# Patient Record
Sex: Male | Born: 1970 | Race: White | Hispanic: No | Marital: Single | Smoking: Current every day smoker
Health system: Southern US, Community
[De-identification: ages and names within clinical notes are randomized; demographics above are authoritative.]

## PROBLEM LIST (undated history)

## (undated) DIAGNOSIS — E039 Hypothyroidism, unspecified: Secondary | ICD-10-CM

## (undated) DIAGNOSIS — F319 Bipolar disorder, unspecified: Secondary | ICD-10-CM

## (undated) DIAGNOSIS — F419 Anxiety disorder, unspecified: Secondary | ICD-10-CM

## (undated) DIAGNOSIS — F209 Schizophrenia, unspecified: Secondary | ICD-10-CM

---

## 2006-09-16 ENCOUNTER — Emergency Department (HOSPITAL_COMMUNITY): Admission: EM | Admit: 2006-09-16 | Discharge: 2006-09-16 | Payer: Self-pay | Admitting: Emergency Medicine

## 2008-11-23 ENCOUNTER — Emergency Department (HOSPITAL_COMMUNITY): Admission: EM | Admit: 2008-11-23 | Discharge: 2008-11-23 | Payer: Self-pay | Admitting: Emergency Medicine

## 2009-04-07 ENCOUNTER — Emergency Department (HOSPITAL_COMMUNITY): Admission: EM | Admit: 2009-04-07 | Discharge: 2009-04-07 | Payer: Self-pay | Admitting: Emergency Medicine

## 2012-08-31 ENCOUNTER — Emergency Department (HOSPITAL_COMMUNITY)
Admission: EM | Admit: 2012-08-31 | Discharge: 2012-08-31 | Discharge status: Against Medical Advice | Payer: Self-pay | Attending: Emergency Medicine | Admitting: Emergency Medicine

## 2012-08-31 DIAGNOSIS — R404 Transient alteration of awareness: Secondary | ICD-10-CM | POA: Insufficient documentation

## 2012-08-31 DIAGNOSIS — IMO0002 Reserved for concepts with insufficient information to code with codable children: Secondary | ICD-10-CM | POA: Insufficient documentation
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
## 2012-08-31 NOTE — ED Notes (Addendum)
Upon arrival to our facility pt was alert and stating "Stop" repeatedly. Pt told need to examine him and check for injuries. Pt persistent in refusing any type of care. ER MD telling patient " You could die if you leave now". Pt states "I don't care I am leaving. Stop caring for me". Pt was alert and oriented. Able to read the time on the clock. Ambulated from stretcher to discharge without difficulty. Pt came over to this RN computer to sign AMA, refused to sign and left. No obvious injuries noted, abrasion to chin. Airway intact, did not seem to be in any distress. Pt adamant about leaving and denied any drug use.

## 2012-11-18 ENCOUNTER — Emergency Department (HOSPITAL_COMMUNITY)
Admission: EM | Admit: 2012-11-18 | Discharge: 2012-11-18 | Disposition: A | Discharge status: Home / Self Care | Payer: Self-pay | Attending: Emergency Medicine | Admitting: Emergency Medicine

## 2012-11-18 ENCOUNTER — Encounter (HOSPITAL_COMMUNITY): Payer: Self-pay | Admitting: Emergency Medicine

## 2012-11-18 ENCOUNTER — Emergency Department (HOSPITAL_COMMUNITY): Payer: Self-pay

## 2012-11-18 DIAGNOSIS — R0602 Shortness of breath: Secondary | ICD-10-CM | POA: Insufficient documentation

## 2012-11-18 DIAGNOSIS — F319 Bipolar disorder, unspecified: Secondary | ICD-10-CM | POA: Insufficient documentation

## 2012-11-18 DIAGNOSIS — F209 Schizophrenia, unspecified: Secondary | ICD-10-CM | POA: Insufficient documentation

## 2012-11-18 DIAGNOSIS — Z87891 Personal history of nicotine dependence: Secondary | ICD-10-CM | POA: Insufficient documentation

## 2012-11-18 DIAGNOSIS — F411 Generalized anxiety disorder: Secondary | ICD-10-CM | POA: Insufficient documentation

## 2012-11-18 DIAGNOSIS — F1010 Alcohol abuse, uncomplicated: Secondary | ICD-10-CM | POA: Insufficient documentation

## 2012-11-18 DIAGNOSIS — F10929 Alcohol use, unspecified with intoxication, unspecified: Secondary | ICD-10-CM

## 2012-11-18 HISTORY — DX: Schizophrenia, unspecified: F20.9

## 2012-11-18 HISTORY — DX: Bipolar disorder, unspecified: F31.9

## 2012-11-18 HISTORY — DX: Anxiety disorder, unspecified: F41.9

## 2012-11-18 LAB — RAPID URINE DRUG SCREEN, HOSP PERFORMED
Cocaine: POSITIVE — AB
Opiates: NOT DETECTED
Tetrahydrocannabinol: NOT DETECTED

## 2012-11-18 LAB — CBC
HCT: 42.6 % (ref 39.0–52.0)
Hemoglobin: 15.2 g/dL (ref 13.0–17.0)
MCH: 32.1 pg (ref 26.0–34.0)
MCV: 90.1 fL (ref 78.0–100.0)
Platelets: 203 10*3/uL (ref 150–400)
RBC: 4.73 MIL/uL (ref 4.22–5.81)
WBC: 6.1 10*3/uL (ref 4.0–10.5)

## 2012-11-18 LAB — COMPREHENSIVE METABOLIC PANEL
AST: 214 U/L — ABNORMAL HIGH (ref 0–37)
CO2: 23 mEq/L (ref 19–32)
Chloride: 102 mEq/L (ref 96–112)
Creatinine, Ser: 0.96 mg/dL (ref 0.50–1.35)
GFR calc Af Amer: >90 mL/min (ref >90)
GFR calc non Af Amer: >90 mL/min (ref >90)
Glucose, Bld: 100 mg/dL — ABNORMAL HIGH (ref 70–99)
Total Bilirubin: 0.3 mg/dL (ref 0.3–1.2)

## 2012-11-18 NOTE — ED Notes (Signed)
With assistance of police at pt bedside, pt turned over to lay supine.  EKG completed.  Pt awakened only for a short while then went back to sleep.

## 2012-11-18 NOTE — ED Notes (Signed)
Pt is not cooperating well, sleeping heavily between time we awaken him.  Is unwilling to share much information.  When asked about allergies, pt said "everything" then rattled off multiple medications.  When asked about reactions he said "all"    Unreliable history.
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
## 2012-11-18 NOTE — ED Notes (Signed)
18 ga IV that was placed by EMS right forearm removed; entire cath in tact.

## 2012-11-18 NOTE — ED Provider Notes (Signed)
CSN: [id number]     Arrival date & time 11/18/12  0344 History   First MD Initiated Contact with Patient 11/18/12 0354     Chief Complaint  Patient presents with  . Chest Pain  . Alcohol Intoxication   (Consider location/radiation/quality/duration/timing/severity/associated sxs/prior Treatment) Patient is a 42 y.o. male presenting with chest pain and intoxication. The history is provided by the patient and the police. The history is limited by the condition of the patient.  Chest Pain Alcohol Intoxication Associated symptoms include chest pain.  pt was arrested for ?public intoxication, stealing beer, was walking from police car to jail when was noted to c/o chest pain. On arrival to ED w gpd, pt very uncooperative, appears intoxicated, and will not answer questions  - level 5 caveat.     No past medical history on file. No past surgical history on file. No family history on file. History  Substance Use Topics  . Smoking status: Not on file  . Smokeless tobacco: Not on file  . Alcohol Use: Not on file    Review of Systems  Unable to perform ROS: Other  Cardiovascular: Positive for chest pain.  level 5 caveat, pt intoxicated, uncooperative w hpi and ros.      Allergies  Review of patient's allergies indicates not on file.  Home Medications  No current outpatient prescriptions on file. BP 105/67  Pulse 70  Temp(Src) 97.5 F (36.4 C)  Resp 16  Ht 5\' 11"  (1.803 m)  Wt 195 lb (88.451 kg)  BMI 27.21 kg/m2  SpO2 96% Physical Exam  Nursing note and vitals reviewed. Constitutional: He appears well-developed and well-nourished. No distress.  HENT:  Head: Atraumatic.  Eyes: Pupils are equal, round, and reactive to light. No scleral icterus.  Neck: Neck supple. No tracheal deviation present.  Cardiovascular: Normal rate, regular rhythm, normal heart sounds and intact distal pulses.  Exam reveals no gallop and no friction rub.   No murmur heard. Pulmonary/Chest: Effort  normal and breath sounds normal. No accessory muscle usage. No respiratory distress. He exhibits tenderness.  Abdominal: Soft. Bowel sounds are normal. He exhibits no distension. There is no tenderness. There is no rebound and no guarding.  Musculoskeletal: Normal range of motion. He exhibits no edema and no tenderness.  Neurological: He is alert.  Alert but uncooperative, intoxicated appearing. Makes purposeful movement bil ext, but not cooperative w exam.   Skin: Skin is warm and dry. He is not diaphoretic.  Psychiatric:  Uncooperative.     ED Course  Procedures (including critical care time)  Results for orders placed during the hospital encounter of 11/18/12  ETHANOL      Result Value Range   Alcohol, Ethyl (B) 288 (*) 0 - 11 mg/dL  CBC      Result Value Range   WBC 6.1  4.0 - 10.5 K/uL   RBC 4.73  4.22 - 5.81 MIL/uL   Hemoglobin 15.2  13.0 - 17.0 g/dL   HCT 40.9  81.1 - 91.4 %   MCV 90.1  78.0 - 100.0 fL   MCH 32.1  26.0 - 34.0 pg   MCHC 35.7  30.0 - 36.0 g/dL   RDW 78.2  95.6 - 21.3 %   Platelets 203  150 - 400 K/uL  COMPREHENSIVE METABOLIC PANEL      Result Value Range   Sodium 139  135 - 145 mEq/L   Potassium 3.3 (*) 3.5 - 5.1 mEq/L   Chloride 102  96 - 112  mEq/L   CO2 23  19 - 32 mEq/L   Glucose, Bld 100 (*) 70 - 99 mg/dL   BUN 10  6 - 23 mg/dL   Creatinine, Ser 1.61  0.50 - 1.35 mg/dL   Calcium 8.9  8.4 - 09.6 mg/dL   Total Protein 7.3  6.0 - 8.3 g/dL   Albumin 3.9  3.5 - 5.2 g/dL   AST 045 (*) 0 - 37 U/L   ALT 235 (*) 0 - 53 U/L   Alkaline Phosphatase 66  39 - 117 U/L   Total Bilirubin 0.3  0.3 - 1.2 mg/dL   GFR calc non Af Amer >90  >90 mL/min   GFR calc Af Amer >90  >90 mL/min  TROPONIN I      Result Value Range   Troponin I <0.30  <0.30 ng/mL   Dg Chest Port 1 View  11/18/2012   CLINICAL DATA:  Shortness of breath.  EXAM: PORTABLE CHEST - 1 VIEW  COMPARISON:  No priors.  FINDINGS: Lung volumes are low. No consolidative airspace disease. No pleural  effusions. No pneumothorax. No pulmonary nodule or mass noted. Pulmonary vasculature and the cardiomediastinal silhouette are within normal limits.  IMPRESSION: 1.  No radiographic evidence of acute cardiopulmonary disease.   Electronically Signed   By: Trudie Reed M.D.   On: 11/18/2012 04:06     EKG Interpretation   None       MDM  Labs, ecg, cxr ordered.  Pt uncooperative. ems ecg of poor quality however no acute st changes noted.   Reviewed nursing notes and prior charts for additional history.   Recheck no cp or discomfort.  Recheck resting comfortably.   Ecg, trop and cxr unremarkable.  Pt with etoh intoxication.  Appears stable for d/c in custody of law enforcement.     Suzi Roots, MD 11/18/12 [phone]

## 2013-12-04 ENCOUNTER — Emergency Department (HOSPITAL_COMMUNITY): Payer: Self-pay

## 2013-12-04 ENCOUNTER — Encounter (HOSPITAL_COMMUNITY): Payer: Self-pay | Admitting: Nurse Practitioner

## 2013-12-04 ENCOUNTER — Emergency Department (HOSPITAL_COMMUNITY)
Admission: EM | Admit: 2013-12-04 | Discharge: 2013-12-04 | Disposition: A | Discharge status: Home / Self Care | Payer: Self-pay | Attending: Emergency Medicine | Admitting: Emergency Medicine

## 2013-12-04 DIAGNOSIS — Y9389 Activity, other specified: Secondary | ICD-10-CM | POA: Insufficient documentation

## 2013-12-04 DIAGNOSIS — Z8639 Personal history of other endocrine, nutritional and metabolic disease: Secondary | ICD-10-CM | POA: Insufficient documentation

## 2013-12-04 DIAGNOSIS — S01311A Laceration without foreign body of right ear, initial encounter: Secondary | ICD-10-CM | POA: Insufficient documentation

## 2013-12-04 DIAGNOSIS — S61204A Unspecified open wound of right ring finger without damage to nail, initial encounter: Secondary | ICD-10-CM | POA: Insufficient documentation

## 2013-12-04 DIAGNOSIS — F419 Anxiety disorder, unspecified: Secondary | ICD-10-CM | POA: Insufficient documentation

## 2013-12-04 DIAGNOSIS — Z79899 Other long term (current) drug therapy: Secondary | ICD-10-CM | POA: Insufficient documentation

## 2013-12-04 DIAGNOSIS — S199XXA Unspecified injury of neck, initial encounter: Secondary | ICD-10-CM | POA: Insufficient documentation

## 2013-12-04 DIAGNOSIS — Y9289 Other specified places as the place of occurrence of the external cause: Secondary | ICD-10-CM | POA: Insufficient documentation

## 2013-12-04 DIAGNOSIS — Y998 Other external cause status: Secondary | ICD-10-CM | POA: Insufficient documentation

## 2013-12-04 DIAGNOSIS — S0990XA Unspecified injury of head, initial encounter: Secondary | ICD-10-CM

## 2013-12-04 DIAGNOSIS — S61451A Open bite of right hand, initial encounter: Secondary | ICD-10-CM

## 2013-12-04 DIAGNOSIS — W503XXA Accidental bite by another person, initial encounter: Secondary | ICD-10-CM

## 2013-12-04 DIAGNOSIS — Z720 Tobacco use: Secondary | ICD-10-CM | POA: Insufficient documentation

## 2013-12-04 DIAGNOSIS — Z7982 Long term (current) use of aspirin: Secondary | ICD-10-CM | POA: Insufficient documentation

## 2013-12-04 HISTORY — DX: Hypothyroidism, unspecified: E03.9

## 2013-12-04 MED ORDER — ONDANSETRON 4 MG PO TBDP
4.0000 mg | ORAL_TABLET | Freq: Once | ORAL | Status: AC
  Administered 2013-12-04: 4 mg via ORAL
  Filled 2013-12-04: qty 1

## 2013-12-04 MED ORDER — AMOXICILLIN-POT CLAVULANATE 875-125 MG PO TABS
1.0000 | ORAL_TABLET | Freq: Once | ORAL | Status: AC
  Administered 2013-12-04: 1 via ORAL
  Filled 2013-12-04: qty 1

## 2013-12-04 MED ORDER — ONDANSETRON HCL 4 MG PO TABS
4.0000 mg | ORAL_TABLET | Freq: Four times a day (QID) | ORAL | Status: AC
Start: 2013-12-04 — End: ?

## 2013-12-04 MED ORDER — TRAMADOL HCL 50 MG PO TABS
50.0000 mg | ORAL_TABLET | Freq: Once | ORAL | Status: AC
  Administered 2013-12-04: 50 mg via ORAL
  Filled 2013-12-04: qty 1

## 2013-12-04 MED ORDER — OXYCODONE-ACETAMINOPHEN 5-325 MG PO TABS
1.0000 | ORAL_TABLET | Freq: Once | ORAL | Status: AC
Start: 2013-12-04 — End: 2013-12-04
  Administered 2013-12-04: 1 via ORAL
  Filled 2013-12-04: qty 1

## 2013-12-04 MED ORDER — AMOXICILLIN-POT CLAVULANATE 500-125 MG PO TABS
1.0000 | ORAL_TABLET | Freq: Three times a day (TID) | ORAL | Status: AC
Start: 2013-12-04 — End: ?

## 2013-12-04 MED ORDER — TRAMADOL HCL 50 MG PO TABS: 50.0000 mg | ORAL_TABLET | Freq: Four times a day (QID) | ORAL | Status: AC | PRN
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
## 2013-12-04 NOTE — Discharge Instructions (Signed)
Concussion °A concussion, or closed-head injury, is a brain injury caused by a direct blow to the head or by a quick and sudden movement (jolt) of the head or neck. Concussions are usually not life-threatening. Even so, the effects of a concussion can be serious. If you have had a concussion before, you are more likely to experience concussion-like symptoms after a direct blow to the head.  °CAUSES °· Direct blow to the head, such as from running into another player during a soccer game, being hit in a fight, or hitting your head on a hard surface. °· A jolt of the head or neck that causes the brain to move back and forth inside the skull, such as in a car crash. °SIGNS AND SYMPTOMS °The signs of a concussion can be hard to notice. Early on, they may be missed by you, family members, and health care providers. You may look fine but act or feel differently. °Symptoms are usually temporary, but they may last for days, weeks, or even longer. Some symptoms may appear right away while others may not show up for hours or days. Every head injury is different. Symptoms include: °· Mild to moderate headaches that will not go away. °· A feeling of pressure inside your head. °· Having more trouble than usual: °· Learning or remembering things you have heard. °· Answering questions. °· Paying attention or concentrating. °· Organizing daily tasks. °· Making decisions and solving problems. °· Slowness in thinking, acting or reacting, speaking, or reading. °· Getting lost or being easily confused. °· Feeling tired all the time or lacking energy (fatigued). °· Feeling drowsy. °· Sleep disturbances. °· Sleeping more than usual. °· Sleeping less than usual. °· Trouble falling asleep. °· Trouble sleeping (insomnia). °· Loss of balance or feeling lightheaded or dizzy. °· Nausea or vomiting. °· Numbness or tingling. °· Increased sensitivity to: °· Sounds. °· Lights. °· Distractions. °· Vision problems or eyes that tire  easily. °· Diminished sense of taste or smell. °· Ringing in the ears. °· Mood changes such as feeling sad or anxious. °· Becoming easily irritated or angry for little or no reason. °· Lack of motivation. °· Seeing or hearing things other people do not see or hear (hallucinations). °DIAGNOSIS °Your health care provider can usually diagnose a concussion based on a description of your injury and symptoms. He or she will ask whether you passed out (lost consciousness) and whether you are having trouble remembering events that happened right before and during your injury. °Your evaluation might include: °· A brain scan to look for signs of injury to the brain. Even if the test shows no injury, you may still have a concussion. °· Blood tests to be sure other problems are not present. °TREATMENT °· Concussions are usually treated in an emergency department, in urgent care, or at a clinic. You may need to stay in the hospital overnight for further treatment. °· Tell your health care provider if you are taking any medicines, including prescription medicines, over-the-counter medicines, and natural remedies. Some medicines, such as blood thinners (anticoagulants) and aspirin, may increase the chance of complications. Also tell your health care provider whether you have had alcohol or are taking illegal drugs. This information may affect treatment. °· Your health care provider will send you home with important instructions to follow. °· How fast you will recover from a concussion depends on many factors. These factors include how severe your concussion is, what part of your brain was injured, your   age, and how healthy you were before the concussion. °· Most people with mild injuries recover fully. Recovery can take time. In general, recovery is slower in older persons. Also, persons who have had a concussion in the past or have other medical problems may find that it takes longer to recover from their current injury. °HOME  CARE INSTRUCTIONS °General Instructions °· Carefully follow the directions your health care provider gave you. °· Only take over-the-counter or prescription medicines for pain, discomfort, or fever as directed by your health care provider. °· Take only those medicines that your health care provider has approved. °· Do not drink alcohol until your health care provider says you are well enough to do so. Alcohol and certain other drugs may slow your recovery and can put you at risk of further injury. °· If it is harder than usual to remember things, write them down. °· If you are easily distracted, try to do one thing at a time. For example, do not try to watch TV while fixing dinner. °· Talk with family members or close friends when making important decisions. °· Keep all follow-up appointments. Repeated evaluation of your symptoms is recommended for your recovery. °· Watch your symptoms and tell others to do the same. Complications sometimes occur after a concussion. Older adults with a brain injury may have a higher risk of serious complications, such as a blood clot on the brain. °· Tell your teachers, school nurse, school counselor, coach, athletic trainer, or work manager about your injury, symptoms, and restrictions. Tell them about what you can or cannot do. They should watch for: °· Increased problems with attention or concentration. °· Increased difficulty remembering or learning new information. °· Increased time needed to complete tasks or assignments. °· Increased irritability or decreased ability to cope with stress. °· Increased symptoms. °· Rest. Rest helps the brain to heal. Make sure you: °· Get plenty of sleep at night. Avoid staying up late at night. °· Keep the same bedtime hours on weekends and weekdays. °· Rest during the day. Take daytime naps or rest breaks when you feel tired. °· Limit activities that require a lot of thought or concentration. These include: °· Doing homework or job-related  work. °· Watching TV. °· Working on the computer. °· Avoid any situation where there is potential for another head injury (football, hockey, soccer, basketball, martial arts, downhill snow sports and horseback riding). Your condition will get worse every time you experience a concussion. You should avoid these activities until you are evaluated by the appropriate follow-up health care providers. °Returning To Your Regular Activities °You will need to return to your normal activities slowly, not all at once. You must give your body and brain enough time for recovery. °· Do not return to sports or other athletic activities until your health care provider tells you it is safe to do so. °· Ask your health care provider when you can drive, ride a bicycle, or operate heavy machinery. Your ability to react may be slower after a brain injury. Never do these activities if you are dizzy. °· Ask your health care provider about when you can return to work or school. °Preventing Another Concussion °It is very important to avoid another brain injury, especially before you have recovered. In rare cases, another injury can lead to permanent brain damage, brain swelling, or death. The risk of this is greatest during the first 7-10 days after a head injury. Avoid injuries by: °· Wearing a seat   belt when riding in a car. °· Drinking alcohol only in moderation. °· Wearing a helmet when biking, skiing, skateboarding, skating, or doing similar activities. °· Avoiding activities that could lead to a second concussion, such as contact or recreational sports, until your health care provider says it is okay. °· Taking safety measures in your home. °¨ Remove clutter and tripping hazards from floors and stairways. °¨ Use grab bars in bathrooms and handrails by stairs. °¨ Place non-slip mats on floors and in bathtubs. °¨ Improve lighting in dim areas. °SEEK MEDICAL CARE IF: °· You have increased problems paying attention or  concentrating. °· You have increased difficulty remembering or learning new information. °· You need more time to complete tasks or assignments than before. °· You have increased irritability or decreased ability to cope with stress. °· You have more symptoms than before. °Seek medical care if you have any of the following symptoms for more than 2 weeks after your injury: °· Lasting (chronic) headaches. °· Dizziness or balance problems. °· Nausea. °· Vision problems. °· Increased sensitivity to noise or light. °· Depression or mood swings. °· Anxiety or irritability. °· Memory problems. °· Difficulty concentrating or paying attention. °· Sleep problems. °· Feeling tired all the time. °SEEK IMMEDIATE MEDICAL CARE IF: °· You have severe or worsening headaches. These may be a sign of a blood clot in the brain. °· You have weakness (even if only in one hand, leg, or part of the face). °· You have numbness. °· You have decreased coordination. °· You vomit repeatedly. °· You have increased sleepiness. °· One pupil is larger than the other. °· You have convulsions. °· You have slurred speech. °· You have increased confusion. This may be a sign of a blood clot in the brain. °· You have increased restlessness, agitation, or irritability. °· You are unable to recognize people or places. °· You have neck pain. °· It is difficult to wake you up. °· You have unusual behavior changes. °· You lose consciousness. °MAKE SURE YOU: °· Understand these instructions. °· Will watch your condition. °· Will get help right away if you are not doing well or get worse. °Document Released: 03/23/2003 Document Revised: 01/05/2013 Document Reviewed: 07/23/2012 °[employer]® Patient Information ©2015 [employer]. This information is not intended to replace advice given to you by your health care provider. Make sure you discuss any questions you have with your health care provider. ° °Facial Laceration ° A facial laceration is a cut on the face.  These injuries can be painful and cause bleeding. Lacerations usually heal quickly, but they need special care to reduce scarring. °DIAGNOSIS  °Your health care provider will take a medical history, ask for details about how the injury occurred, and examine the wound to determine how deep the cut is. °TREATMENT  °Some facial lacerations may not require closure. Others may not be able to be closed because of an increased risk of infection. The risk of infection and the chance for successful closure will depend on various factors, including the amount of time since the injury occurred. °The wound may be cleaned to help prevent infection. If closure is appropriate, pain medicines may be given if needed. Your health care provider will use stitches (sutures), wound glue (adhesive), or skin adhesive strips to repair the laceration. These tools bring the skin edges together to allow for faster healing and a better cosmetic outcome. If needed, you may also be given a tetanus shot. °HOME CARE INSTRUCTIONS °· Only   take over-the-counter or prescription medicines as directed by your health care provider. °· Follow your health care provider's instructions for wound care. These instructions will vary depending on the technique used for closing the wound. °For Sutures: °· Keep the wound clean and dry.   °· If you were given a bandage (dressing), you should change it at least once a day. Also change the dressing if it becomes wet or dirty, or as directed by your health care provider.   °· Wash the wound with soap and water 2 times a day. Rinse the wound off with water to remove all soap. Pat the wound dry with a clean towel.   °· After cleaning, apply a thin layer of the antibiotic ointment recommended by your health care provider. This will help prevent infection and keep the dressing from sticking.   °· You may shower as usual after the first 24 hours. Do not soak the wound in water until the sutures are removed.   °· Get your  sutures removed as directed by your health care provider. With facial lacerations, sutures should usually be taken out after 4-5 days to avoid stitch marks.   °· Wait a few days after your sutures are removed before applying any makeup. °For Skin Adhesive Strips: °· Keep the wound clean and dry.   °· Do not get the skin adhesive strips wet. You may bathe carefully, using caution to keep the wound dry.   °· If the wound gets wet, pat it dry with a clean towel.   °· Skin adhesive strips will fall off on their own. You may trim the strips as the wound heals. Do not remove skin adhesive strips that are still stuck to the wound. They will fall off in time.   °For Wound Adhesive: °· You may briefly wet your wound in the shower or bath. Do not soak or scrub the wound. Do not swim. Avoid periods of heavy sweating until the skin adhesive has fallen off on its own. After showering or bathing, gently pat the wound dry with a clean towel.   °· Do not apply liquid medicine, cream medicine, ointment medicine, or makeup to your wound while the skin adhesive is in place. This may loosen the film before your wound is healed.   °· If a dressing is placed over the wound, be careful not to apply tape directly over the skin adhesive. This may cause the adhesive to be pulled off before the wound is healed.   °· Avoid prolonged exposure to sunlight or tanning lamps while the skin adhesive is in place. °· The skin adhesive will usually remain in place for 5-10 days, then naturally fall off the skin. Do not pick at the adhesive film.   °After Healing: °Once the wound has healed, cover the wound with sunscreen during the day for 1 full year. This can help minimize scarring. Exposure to ultraviolet light in the first year will darken the scar. It can take 1-2 years for the scar to lose its redness and to heal completely.  °SEEK IMMEDIATE MEDICAL CARE IF: °· You have redness, pain, or swelling around the wound.   °· You see a yellowish-white  fluid (pus) coming from the wound.   °· You have chills or a fever.   °MAKE SURE YOU: °· Understand these instructions. °· Will watch your condition. °· Will get help right away if you are not doing well or get worse. °Document Released: 02/08/2004 Document Revised: 10/21/2012 Document Reviewed: 08/13/2012 °[employer]® Patient Information ©2015 [employer]. This information is not intended to replace advice given to   you by your health care provider. Make sure you discuss any questions you have with your health care provider. ° °

## 2013-12-04 NOTE — ED Notes (Signed)
Patient transported to CT 
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
## 2013-12-04 NOTE — ED Notes (Signed)
MD at bedside. 
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
## 2017-08-14 DEATH — deceased
